# Patient Record
Sex: Male | Born: 2000 | Race: White | Hispanic: No | Marital: Single | Smoking: Never smoker
Health system: Southern US, Community
[De-identification: ages and names within clinical notes are randomized; demographics above are authoritative.]

## PROBLEM LIST (undated history)

## (undated) DIAGNOSIS — F419 Anxiety disorder, unspecified: Secondary | ICD-10-CM

## (undated) DIAGNOSIS — G4733 Obstructive sleep apnea (adult) (pediatric): Secondary | ICD-10-CM

## (undated) HISTORY — DX: Obstructive sleep apnea (adult) (pediatric): G47.33

## (undated) HISTORY — PX: NOSE SURGERY: SHX723

## (undated) HISTORY — DX: Anxiety disorder, unspecified: F41.9

---
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
## 2020-01-13 ENCOUNTER — Encounter: Payer: Self-pay | Admitting: General Practice

## 2023-11-19 DIAGNOSIS — G4733 Obstructive sleep apnea (adult) (pediatric): Secondary | ICD-10-CM | POA: Insufficient documentation

## 2024-05-26 ENCOUNTER — Ambulatory Visit (INDEPENDENT_AMBULATORY_CARE_PROVIDER_SITE_OTHER): Payer: Self-pay | Admitting: Student

## 2024-05-26 ENCOUNTER — Encounter: Payer: Self-pay | Admitting: Student

## 2024-05-26 VITALS — BP 130/73 | HR 67 | Temp 98.2°F | Ht 70.0 in | Wt 226.0 lb

## 2024-05-26 DIAGNOSIS — F410 Panic disorder [episodic paroxysmal anxiety] without agoraphobia: Secondary | ICD-10-CM

## 2024-05-26 DIAGNOSIS — R030 Elevated blood-pressure reading, without diagnosis of hypertension: Secondary | ICD-10-CM

## 2024-05-26 DIAGNOSIS — F419 Anxiety disorder, unspecified: Secondary | ICD-10-CM | POA: Diagnosis not present

## 2024-05-26 DIAGNOSIS — Z7689 Persons encountering health services in other specified circumstances: Secondary | ICD-10-CM

## 2024-05-26 MED ORDER — ESCITALOPRAM OXALATE 5 MG PO TABS: 5.0000 mg | ORAL_TABLET | Freq: Every day | ORAL | 3 refills | Status: DC

## 2024-05-26 NOTE — Assessment & Plan Note (Signed)
 Alan Mccormick

## 2024-05-26 NOTE — Assessment & Plan Note (Addendum)
 SABRA

## 2024-05-26 NOTE — Patient Instructions (Addendum)
 Thank you, [name] for allowing us  to provide your care today. Today we discussed:  -Please continue measuring blood pressure at home with the appropriate sized cuff to ensure correct readings. Check when calm/at rest with feet flat on the ground (not crossed). Record your blood pressure readings (3-4 times per week, aim to measure around the same time).  -You can send over MyChart so that we can determine if we need to start blood pressure medication.   -START Lexapro 5 mg daily, after 1 week if doing well can increase to 10 mg daily  -Continue counseling/therapy at [facility]  Here are some local behavioral health centers listed below: -[facility]: [phone]) [phone] -[facility]: [phone]  If you feel like you may hurt yourself or others, or have thoughts about taking your own life, please get help right away. Here are some resources available to you: -Call your local emergency services (911) -Call the [facility] and [employer] Helpline [phone]) -Go to the nearest emergency room. -Call a suicide hotline to talk to a trained counselor.  1-800-273-TALK 218-577-2683) 1-800-SUICIDE 951-800-1886 (For Spanish speakers) 520-127-1177 (For TTY users) 1-866-4-U-TREVOR 873-281-4598) (For LGBTQ community)  75 (This is a crisis hotline you may call or text)   I have ordered the following medication/changed the following medications:  Start the following medications: Meds ordered this encounter  Medications   escitalopram (LEXAPRO) 5 MG tablet    Sig: Take 1 tablet (5 mg total) by mouth daily. If tolerating, increase to 10 mg daily after 1 week.    Dispense:  90 tablet    Refill:  3     Follow up: 5-6 weeks   Should you have any questions or concerns please call the internal medicine clinic at [phone].    Binh Doten, D.O. [facility]

## 2024-05-26 NOTE — Progress Notes (Unsigned)
 CC: Establish care, anxiety   HPI: AlanAlan Mccormick is a 23 y.o. male living with a history stated below and presents today for establish care. Please see problem based assessment and plan for additional details.  PMH Elevated BP vs HTN OSA on CPAP Anxiety w/ panic attacks   PSH Nasal septum surgery (7th grade)  Meds -probiotic/fiber supplement -elderberry extract -biotin   Allergies NKDA  FH Mother- skin cancer (unclear exact diagnosis) Father- HTN  SH Lives: [place] w/ parents Occupation: Masters in it sales professional at [employer] PCP: previously [employer] [facility] [employer] Tobacco use: denies  Alcohol use: 2 glasses of wine nightly Illicit drug use: occasional marijuana   Social History   Socioeconomic History   Marital status: Single    Spouse name: Not on file   Number of children: Not on file   Years of education: Not on file   Highest education level: Not on file  Occupational History   Not on file  Tobacco Use   Smoking status: Never   Smokeless tobacco: Never  Substance and Sexual Activity   Alcohol use: No   Drug use: No   Sexual activity: Not on file  Other Topics Concern   Not on file  Social History Narrative   Not on file   Social Drivers of Health   Financial Resource Strain: Not on file  Food Insecurity: Not on file  Transportation Needs: Not on file  Physical Activity: Not on file  Stress: Not on file  Social Connections: Not on file  Intimate Partner Violence: Not on file    Review of Systems: ROS negative except for what is noted on the assessment and plan.  Vitals:   05/26/24 1520 05/26/24 1549  BP: (!) 142/87 130/73  Pulse: 75 67  Temp: 98.2 F (36.8 C)   TempSrc: Oral   SpO2: 100%   Weight: 226 lb (102.5 kg)   Height: 5' 10 (1.778 m)    Physical Exam: Constitutional: well-appearing male sitting in chair, in no acute distress HENT: normocephalic atraumatic Cardiovascular: regular rate and rhythm Pulmonary/Chest:  normal work of breathing on room air, lungs clear to auscultation bilaterally MSK: no LE edema Neurological: alert & oriented x 3 Skin: warm and dry Psych: anxious mood, appropriate behavior, denies SI  Assessment & Plan:   Assessment & Plan Encounter to establish care Requesting establish at [facility]  Previously seen by [facility] physicians and also [facility] family medicine.  He has moved back to [place] to pursue masters degree at [employer].  His primary concern today is elevated blood pressure and anxiety with panic attacks.    Anxiety Panic attack Reports history of anxiety and panic attacks seen by previous PCP.  GAD 14 and PHQ 10. Was prescribed sertraline but stopped last year due to side effects of weight gain, flat affect and sexual dysfunction.  States anxiety more than depressed symptoms.  No symptoms to support mania.  He reports panic attacks that can last 30 minutes.  Feeling of impending doom, chest tightness and shortness of breath, diaphoresis.  Currently in the midst of finals for his masters degree.  Stated Zoloft did help some but cannot tolerate due to side effects. Of note, previous seen by cardiology for elevated BP.  He was also seen by pediatric nephrology.  He is on CPAP for OSA.  He is receiving counseling/therapy through [facility] which he wants to continue.  Did offer options of referral to psychiatry/psychology/IBH, patient declined at this time.  Discussed trialing  another SSRI (one that is less activating) which he is agreeable to, if that fails he is willing to try SNRI.  Declined additions such as BuSpar or hydroxyzine at this time.  Plan -Continue counseling/therapy at [facility] Lexapro 5 mg daily with plans to increase to 10 mg -Follow-up in 5-6 weeks either in person or telehealth visit -Can consider SNRI, BuSpar at future visits if needed -Resources provided on AVS    Elevated BP reading w/ no diagnosis of HTN Reports history of this.  Not on any antihypertensives at  this time.  BP improved to 130/73 today.  Seen by cardiology in the past, no medications at that time and recommended new BP cuff due to current cuff being too small.  Patient plans to obtain new BP monitor/cuff.  He will record home BP readings and send to us .  He is agreeable for antihypertensives and I believe his anxiety factors into this as well.  Plan -Home BP recordings that patient will send back to office -Continue lifestyle modifications and including healthy eating habits and physical activity throughout the week       Return in about 5 weeks (around 06/30/2024) for follow up visit (can be telehealth).   Patient discussed with Dr. CHARLENA Rosan Ozell Elicia, D.O. [facility] Internal Medicine, PGY-3 Clinic Phone: [phone] Date 05/26/2024 Time 5:03 PM

## 2024-06-06 NOTE — Progress Notes (Signed)
 Internal Medicine Clinic Attending  Case discussed with the resident at the time of the visit.  We reviewed the resident's history and exam and pertinent patient test results.  I agree with the assessment, diagnosis, and plan of care documented in the resident's note.

## 2024-06-14 ENCOUNTER — Telehealth: Payer: Self-pay | Admitting: Student

## 2024-06-14 DIAGNOSIS — R030 Elevated blood-pressure reading, without diagnosis of hypertension: Secondary | ICD-10-CM

## 2024-06-14 DIAGNOSIS — F419 Anxiety disorder, unspecified: Secondary | ICD-10-CM

## 2024-06-14 NOTE — Progress Notes (Signed)
 Video visit was having technical difficulties to OV was transitioned to telehealth.   [facility] Internal Medicine Residency Telephone Encounter Continuity Care Appointment  HPI:  This telephone encounter was created for Alan Mccormick on 06/20/2024 for the following purpose/cc follow-up for anxiety and BP.   Past Medical History:  Past Medical History:  Diagnosis Date   Anxiety    OSA (obstructive sleep apnea)      ROS:  Denies chest pain, visual changes, HA   Assessment / Plan / Recommendations:  Elevated BP reading w/ no diagnosis of HTN: Has started to check BP at home with systolic readings in 120's-130's. Will continue lifestyle modifications for now which he has started working on.  Anxiety: Seen for this while establishing care on 11/6. At that time he was started on Lexapro  5 mg daily. He states symptoms have improved. Denies SI/HI. Will continue this dose and he will call to schedule follow-up appointment for mid-December and we can consider increasing to 10 mg if needed.  -Continue counseling/therapy at [facility]   Consent and Medical Decision Making:  Patient discussed with Dr. Karna This is a telephone encounter between Alan Mccormick and Norman Lobstein on 06/20/2024 for follow-up. The visit was conducted with the patient located at home and Norman Lobstein at [facility]. The patient's identity was confirmed using their DOB and current address. The patient has consented to being evaluated through a telephone encounter and understands the associated risks (an examination cannot be done and the patient may need to come in for an appointment) / benefits (allows the patient to remain at home, decreasing exposure to coronavirus). I personally spent 15 minutes on medical discussion.

## 2024-06-20 NOTE — Assessment & Plan Note (Signed)
 Seen for this while establishing care on 11/6. At that time he was started on Lexapro  5 mg daily. He states symptoms have improved. Denies SI/HI. Will continue this dose and he will call to schedule follow-up appointment for mid-December and we can consider increasing to 10 mg if needed.  -Continue counseling/therapy at [facility]

## 2024-06-20 NOTE — Progress Notes (Signed)
 Internal Medicine Clinic Attending  Case discussed with the resident at the time of the visit.  We reviewed the resident's history and exam and pertinent patient test results.  I agree with the assessment, diagnosis, and plan of care documented in the resident's note.

## 2024-06-20 NOTE — Assessment & Plan Note (Signed)
 Has started to check BP at home with systolic readings in 120's-130's. Will continue lifestyle modifications for now which he has started working on.

## 2024-06-20 NOTE — Addendum Note (Signed)
 Addended by: KARNA FELLOWS on: 06/20/2024 11:19 AM   Modules accepted: Level of Service

## 2024-07-08 ENCOUNTER — Ambulatory Visit: Payer: Self-pay | Admitting: Student

## 2024-07-08 ENCOUNTER — Encounter: Payer: Self-pay | Admitting: Student

## 2024-07-08 VITALS — BP 123/84 | HR 71 | Temp 98.8°F | Ht 70.0 in | Wt 221.0 lb

## 2024-07-08 DIAGNOSIS — F410 Panic disorder [episodic paroxysmal anxiety] without agoraphobia: Secondary | ICD-10-CM | POA: Diagnosis not present

## 2024-07-08 DIAGNOSIS — F411 Generalized anxiety disorder: Secondary | ICD-10-CM

## 2024-07-08 DIAGNOSIS — Z79899 Other long term (current) drug therapy: Secondary | ICD-10-CM | POA: Diagnosis not present

## 2024-07-08 DIAGNOSIS — R0981 Nasal congestion: Secondary | ICD-10-CM | POA: Insufficient documentation

## 2024-07-08 MED ORDER — ESCITALOPRAM OXALATE 20 MG PO TABS: 20.0000 mg | ORAL_TABLET | Freq: Every day | ORAL | 2 refills | Status: AC

## 2024-07-08 NOTE — Progress Notes (Signed)
 Internal Medicine Clinic Attending  Case discussed with the resident at the time of the visit.  We reviewed the resident's history and exam and pertinent patient test results.  I agree with the assessment, diagnosis, and plan of care documented in the resident's note.

## 2024-07-08 NOTE — Assessment & Plan Note (Addendum)
 He has been dealing with mood disorder for the last 4 to 5 years.  There are components of depression such as anhedonia but primarily he deals with anxiety, worse with driving, and occasional panic attacks.  Overall he feels improvement having started Lexapro  this fall - and anxiety attacks are rare.  Certainly no SI.  He has been on 10 mg dose for a few weeks.  Primary side effect is some restlessness when trying to fall asleep, but he states it is mild.  Overall he feels he is improving with medicine and would like to increase his dose. - Increase escitalopram  from 10 to 20 mg daily - If unable to tolerate, alternative medicine such as BuSpar for antianxiety component might benefit him -Had a counselor at [facility] who has since left, he does not want to pursue alternative counseling at this time, but he knows that we can do that through this office if he would like

## 2024-07-08 NOTE — Assessment & Plan Note (Signed)
 He has severe recurrent nasal congestion that is amenable to Flonase but he is dissatisfied with recurrence.  Has a history of a broken nose as an adolescent also uses CPAP for OSA.  Is not having infections.  On examination , I cannot appreciate acute or severe structural abnormalities in the nares.  He wonders if an ENT might be able to offer help. - I will refer him to ENT at his request

## 2024-07-08 NOTE — Progress Notes (Signed)
" ° °  CC: Anxiety and requests referral to ENT  HPI:  [name] is a 23 y.o. male with a PMH stated below who presents today for evaluation.  Please see problem based assessment and plan for additional details.  Past Medical History:  Diagnosis Date   Anxiety    OSA (obstructive sleep apnea)     Review of Systems: ROS negative except for what is noted on the assessment and plan.  Vitals:   07/08/24 1025  BP: 123/84  Pulse: 71  Temp: 98.8 F (37.1 C)  TempSrc: Oral  SpO2: 96%  Weight: 221 lb (100.2 kg)  Height: 5' 10 (1.778 m)   Physical Exam: Constitutional: well-appearing man in no acute distress HENT: normocephalic atraumatic, mucous membranes moist. Nares are clear, turbinates are pink and moist. Cardiovascular: regular rate and rhythm, no m/r/g Pulmonary/Chest: normal work of breathing on room air, lungs clear to auscultation bilaterally MSK: normal bulk and tone Neurological: alert & oriented x 3 Skin: warm and dry Psych: normal mood and behavior  Assessment & Plan:   Patient discussed with Dr. Shawn  Generalized anxiety disorder with panic attacks He has been dealing with mood disorder for the last 4 to 5 years.  There are components of depression such as anhedonia but primarily he deals with anxiety, worse with driving, and occasional panic attacks.  Overall he feels improvement having started Lexapro  this fall - and anxiety attacks are rare.  Certainly no SI.  He has been on 10 mg dose for a few weeks.  Primary side effect is some restlessness when trying to fall asleep, but he states it is mild.  Overall he feels he is improving with medicine and would like to increase his dose. - Increase escitalopram  from 10 to 20 mg daily - If unable to tolerate, alternative medicine such as BuSpar for antianxiety component might benefit him -Had a counselor at [facility] who has since left, he does not want to pursue alternative counseling at this time, but he knows that we can  do that through this office if he would like  Chronic nasal congestion He has severe recurrent nasal congestion that is amenable to Flonase but he is dissatisfied with recurrence.  Has a history of a broken nose as an adolescent also uses CPAP for OSA.  Is not having infections.  On examination , I cannot appreciate acute or severe structural abnormalities in the nares.  He wonders if an ENT might be able to offer help. - I will refer him to ENT at his request  RTC in 4-6 weeks for anxiety  Lonni Africa, D.O. [facility] Internal Medicine, PGY-2 Phone: [phone] Date 07/08/2024 Time 11:39 AM "

## 2024-08-12 ENCOUNTER — Encounter: Payer: Self-pay | Admitting: Student

## 2024-08-12 ENCOUNTER — Ambulatory Visit: Payer: Self-pay | Admitting: Student

## 2024-08-12 VITALS — BP 145/78 | HR 64 | Temp 98.4°F | Ht 70.0 in | Wt 221.0 lb

## 2024-08-12 DIAGNOSIS — F410 Panic disorder [episodic paroxysmal anxiety] without agoraphobia: Secondary | ICD-10-CM | POA: Diagnosis not present

## 2024-08-12 DIAGNOSIS — R41840 Attention and concentration deficit: Secondary | ICD-10-CM | POA: Diagnosis not present

## 2024-08-12 DIAGNOSIS — F411 Generalized anxiety disorder: Secondary | ICD-10-CM | POA: Diagnosis not present

## 2024-08-12 DIAGNOSIS — R030 Elevated blood-pressure reading, without diagnosis of hypertension: Secondary | ICD-10-CM

## 2024-08-12 NOTE — Assessment & Plan Note (Signed)
 Patient reports concern of difficulty concentrating.  He feels that his difficulty with concentration has worsened since increasing to Lexapro  20 mg.  During our discussion, he stated that he has had difficulty concentrating since he was in third grade and has not been tested for ADHD and nor has he been treated for this.  He stated that the symptoms fluctuate.  The increase in difficulty concentrating increased last week and he feels that it is possibly impacting school and he has the symptoms at both school and home.  With his long-term history of difficulty concentrating, I suspect that he has underlying ADHD.  Plan: - Psychiatry referral sent for ADHD testing

## 2024-08-12 NOTE — Progress Notes (Signed)
 "  Established Patient Office Visit  Subjective   Patient ID: Alan Mccormick, male    DOB: [birth year]  Age: 24 y.o. MRN: [id number]  Chief Complaint  Patient presents with   Follow-up    Difficulty focusing.    Alan Mccormick is a 24 y.o. who presents to the clinic for anxiety, difficulty focusing, and. Please see problem based assessment and plan for additional details.  Patient Active Problem List   Diagnosis Date Noted   Difficulty concentrating 08/12/2024   Chronic nasal congestion 07/08/2024   Generalized anxiety disorder with panic attacks 05/26/2024   Panic attack 05/26/2024   Elevated BP reading w/ no diagnosis of HTN 05/26/2024   OSA (obstructive sleep apnea) 11/19/2023       Objective:     BP (!) 145/78 (BP Location: Right Arm, Patient Position: Sitting, Cuff Size: Large)   Pulse 64   Temp 98.4 F (36.9 C) (Oral)   Ht 5' 10 (1.778 m)   Wt 221 lb (100.2 kg)   SpO2 98% Comment: RA  BMI 31.71 kg/m  BP Readings from Last 3 Encounters:  08/12/24 (!) 145/78  07/08/24 123/84  05/26/24 130/73   Wt Readings from Last 3 Encounters:  08/12/24 221 lb (100.2 kg)  07/08/24 221 lb (100.2 kg)  05/26/24 226 lb (102.5 kg)      Physical Exam Vitals reviewed.  Constitutional:      General: He is not in acute distress.    Appearance: He is not ill-appearing, toxic-appearing or diaphoretic.  Cardiovascular:     Rate and Rhythm: Normal rate and regular rhythm.  Pulmonary:     Effort: Pulmonary effort is normal. No respiratory distress.     Breath sounds: Normal breath sounds.  Skin:    General: Skin is warm and dry.  Neurological:     Mental Status: He is alert.  Psychiatric:        Mood and Affect: Affect normal. Mood is anxious.       The ASCVD Risk score (Arnett DK, et al., 2019) failed to calculate for the following reasons:   The 2019 ASCVD risk score is only valid for ages 23 to 58   * - Cholesterol units were assumed    Assessment & Plan:   Problem List  Items Addressed This Visit       Other   Generalized anxiety disorder with panic attacks   Patient presents for follow-up of generalized anxiety disorder with panic attacks.  1 month prior, his Lexapro  was increased from 10 mg to 20 mg.  His GAD-7 score has decreased from 14 in November to 4 today.  He reports better control of anxiety and has not had a panic attack since increasing the Lexapro  to 20 mg.  He does report difficulty achieving orgasm however this is not too bothersome for him and he stated that it is getting better with time since increasing the dose.  Plan: - Continue with Lexapro  20 mg daily -Will continue follow-up on adverse effects of Lexapro  at follow-ups      Elevated BP reading w/ no diagnosis of HTN   Patient was previously assessed by cardiology for elevated blood pressures in the past.  At that time, cardiology did not feel that medications would be beneficial and that he would benefit greatly from CPAP use as well as diet/exercise and weight loss.  Per review of vitals, patient's blood pressure fluctuates from well-controlled to elevated.  His blood pressure today is elevated at 143/79, rechecked  at 145/78.  Plan: - Patient will keep a log of blood pressures, at least 3 to 4/week -Will follow-up on need for medication at follow-up appointment -Continue use of CPAP at night for OSA as well as diet/exercise      Difficulty concentrating - Primary   Patient reports concern of difficulty concentrating.  He feels that his difficulty with concentration has worsened since increasing to Lexapro  20 mg.  During our discussion, he stated that he has had difficulty concentrating since he was in third grade and has not been tested for ADHD and nor has he been treated for this.  He stated that the symptoms fluctuate.  The increase in difficulty concentrating increased last week and he feels that it is possibly impacting school and he has the symptoms at both school and home.  With  his long-term history of difficulty concentrating, I suspect that he has underlying ADHD.  Plan: - Psychiatry referral sent for ADHD testing      Relevant Orders   Ambulatory referral to Psychiatry    Return in about 3 months (around 11/10/2024) for Anxiety/ elevated BP.    Damien Lease, DO  "

## 2024-08-12 NOTE — Assessment & Plan Note (Signed)
 Patient presents for follow-up of generalized anxiety disorder with panic attacks.  1 month prior, his Lexapro  was increased from 10 mg to 20 mg.  His GAD-7 score has decreased from 14 in November to 4 today.  He reports better control of anxiety and has not had a panic attack since increasing the Lexapro  to 20 mg.  He does report difficulty achieving orgasm however this is not too bothersome for him and he stated that it is getting better with time since increasing the dose.  Plan: - Continue with Lexapro  20 mg daily -Will continue follow-up on adverse effects of Lexapro  at follow-ups

## 2024-08-12 NOTE — Patient Instructions (Addendum)
 Thank you, [name] for allowing us  to provide your care today. Today we discussed anxiety, difficulty focusing and elevated blood pressure. We sent a referral to psychiatry for ADHD testing. For the blood pressure, keep a log (BP 3-4 times per week) and bring it with your cuff to the follow up appointment in 3 months!     Referrals ordered today:    Referral Orders         Ambulatory referral to Psychiatry      I have ordered the following medication/changed the following medications:   Stop the following medications: There are no discontinued medications.   Start the following medications: No orders of the defined types were placed in this encounter.    Follow up: 3 months    Remember: To keep track of your blood pressure and to use your CPAP at night  Should you have any questions or concerns please call the internal medicine clinic at [phone].     Please note that our late policy has changed.  If you are more than 15 minutes late to your appointment, you may be asked to reschedule your appointment.  Dr. Kandis, D.O. [facility]

## 2024-08-12 NOTE — Assessment & Plan Note (Signed)
 Patient was previously assessed by cardiology for elevated blood pressures in the past.  At that time, cardiology did not feel that medications would be beneficial and that he would benefit greatly from CPAP use as well as diet/exercise and weight loss.  Per review of vitals, patient's blood pressure fluctuates from well-controlled to elevated.  His blood pressure today is elevated at 143/79, rechecked at 145/78.  Plan: - Patient will keep a log of blood pressures, at least 3 to 4/week -Will follow-up on need for medication at follow-up appointment -Continue use of CPAP at night for OSA as well as diet/exercise

## 2024-08-20 NOTE — Progress Notes (Signed)
 Internal Medicine Clinic Attending  Case discussed with the resident at the time of the visit.  We reviewed the resident's history and exam and pertinent patient test results.  I agree with the assessment, diagnosis, and plan of care documented in the resident's note.

## 2024-08-31 ENCOUNTER — Institutional Professional Consult (permissible substitution) (INDEPENDENT_AMBULATORY_CARE_PROVIDER_SITE_OTHER): Admitting: Otolaryngology

## 2024-11-11 ENCOUNTER — Ambulatory Visit: Payer: Self-pay
